# Patient Record
Sex: Male | Born: 1955 | Race: White | Hispanic: No | Marital: Married | Smoking: Never smoker
Health system: Southern US, Community
[De-identification: ages and names within clinical notes are randomized; demographics above are authoritative.]

## PROBLEM LIST (undated history)

## (undated) HISTORY — PX: SEPTOPLASTY: SUR1290

## (undated) HISTORY — PX: HERNIA REPAIR: SHX51

## (undated) HISTORY — PX: FOOT SURGERY: SHX648

---

## 2013-12-07 ENCOUNTER — Encounter: Payer: Self-pay | Admitting: Sports Medicine

## 2013-12-07 ENCOUNTER — Ambulatory Visit (INDEPENDENT_AMBULATORY_CARE_PROVIDER_SITE_OTHER): Payer: BC Managed Care – PPO | Admitting: Sports Medicine

## 2013-12-07 ENCOUNTER — Ambulatory Visit (INDEPENDENT_AMBULATORY_CARE_PROVIDER_SITE_OTHER): Payer: BC Managed Care – PPO

## 2013-12-07 VITALS — BP 131/64 | HR 77 | Ht 72.0 in | Wt 184.0 lb

## 2013-12-07 DIAGNOSIS — M19072 Primary osteoarthritis, left ankle and foot: Secondary | ICD-10-CM | POA: Insufficient documentation

## 2013-12-07 DIAGNOSIS — M25579 Pain in unspecified ankle and joints of unspecified foot: Secondary | ICD-10-CM

## 2013-12-07 DIAGNOSIS — M19079 Primary osteoarthritis, unspecified ankle and foot: Secondary | ICD-10-CM

## 2013-12-07 DIAGNOSIS — M25539 Pain in unspecified wrist: Secondary | ICD-10-CM

## 2013-12-07 DIAGNOSIS — M19039 Primary osteoarthritis, unspecified wrist: Secondary | ICD-10-CM

## 2013-12-07 MED ORDER — MELOXICAM 15 MG PO TABS: ORAL_TABLET | ORAL | Status: DC

## 2013-12-07 NOTE — Assessment & Plan Note (Signed)
X-rays, Mobic. Aspiration and injection as above. Return in one month.

## 2013-12-07 NOTE — Assessment & Plan Note (Signed)
Bilateral x-rays. Left-sided radiocarpal joint injection as above. Mobic. Return in one month.

## 2013-12-07 NOTE — Progress Notes (Signed)
Subjective:    I'm seeing this patient as a consultation for:  Dr. Greggory Brandy  CC: Left wrist pain, left ankle pain  HPI: Left wrist pain: Present for years. Localized at the radiocarpal joint, moderate, persistent, worse with flexion of the wrist. No radiation. No mechanical symptoms, no trauma.  Left ankle pain: Present for years, localized to the talocrural joint, moderate, persistent with occasional swelling.  Past medical history, Surgical history, Family history not pertinant except as noted below, Social history, Allergies, and medications have been entered into the medical record, reviewed, and no changes needed.   Review of Systems: No headache, visual changes, nausea, vomiting, diarrhea, constipation, dizziness, abdominal pain, skin rash, fevers, chills, night sweats, weight loss, swollen lymph nodes, body aches, joint swelling, muscle aches, chest pain, shortness of breath, mood changes, visual or auditory hallucinations.   Objective:   General: Well Developed, well nourished, and in no acute distress.  Neuro/Psych: Alert and oriented x3, extra-ocular muscles intact, able to move all 4 extremities, sensation grossly intact. Skin: Warm and dry, no rashes noted.  Respiratory: Not using accessory muscles, speaking in full sentences, trachea midline.  Cardiovascular: Pulses palpable, no extremity edema. Abdomen: Does not appear distended. Left Wrist: Inspection normal with no visible erythema or swelling. ROM smooth and normal with good flexion and extension and ulnar/radial deviation that is symmetrical with opposite wrist. There is reproduction of pain with flexion of the wrist, pain is localized volar to dorsal deep in the radiocarpal joint. Palpation is normal over metacarpals, navicular, lunate, and TFCC; tendons without tenderness/ swelling No snuffbox tenderness. No tenderness over Canal of Guyon. Strength 5/5 in all directions without pain. Negative Finkelstein,  tinel's and phalens. Negative Watson's test. Left Ankle: Probable effusion with a fluid wave, tenderness to palpation at the talocrural joint. Range of motion is full in all directions. Strength is 5/5 in all directions. Stable lateral and medial ligaments; squeeze test and kleiger test unremarkable; No pain at base of 5th MT; No tenderness over cuboid; No tenderness over N spot or navicular prominence No tenderness on posterior aspects of lateral and medial malleolus No sign of peroneal tendon subluxations or tenderness to palpation Negative tarsal tunnel tinel's Able to walk 4 steps.  Procedure: Real-time Ultrasound Guided Injection of left talocrural joint Device: GE Logiq E  Verbal informed consent obtained.  Time-out conducted.  Noted no overlying erythema, induration, or other signs of local infection.  Skin prepped in a sterile fashion.  Local anesthesia: Topical Ethyl chloride.  With sterile technique and under real time ultrasound guidance:  1 cc Kenalog 40, 3 cc lidocaine injected easily. Completed without difficulty  Pain immediately resolved suggesting accurate placement of the medication.  Advised to call if fevers/chills, erythema, induration, drainage, or persistent bleeding.  Images permanently stored and available for review in the ultrasound unit.  Impression: Technically successful ultrasound guided injection.  Procedure: Real-time Ultrasound Guided Injection of left radiocarpal joint Device: GE Logiq E  Verbal informed consent obtained.  Time-out conducted.  Noted no overlying erythema, induration, or other signs of local infection.  Skin prepped in a sterile fashion.  Local anesthesia: Topical Ethyl chloride.  With sterile technique and under real time ultrasound guidance:  1 cc Kenalog 40, 2 cc lidocaine injected easily. Completed without difficulty  Pain immediately resolved suggesting accurate placement of the medication.  Advised to call if  fevers/chills, erythema, induration, drainage, or persistent bleeding.  Images permanently stored and available for review in the ultrasound unit.  Impression: Technically successful ultrasound guided injection.  The ankle was strapped compressive dressing.  Impression and Recommendations:   This case required medical decision making of moderate complexity.

## 2014-01-04 ENCOUNTER — Ambulatory Visit: Payer: BC Managed Care – PPO | Admitting: Sports Medicine

## 2014-01-11 ENCOUNTER — Ambulatory Visit: Payer: BC Managed Care – PPO | Admitting: Sports Medicine

## 2014-01-17 ENCOUNTER — Encounter: Payer: Self-pay | Admitting: Sports Medicine

## 2014-01-17 ENCOUNTER — Ambulatory Visit (INDEPENDENT_AMBULATORY_CARE_PROVIDER_SITE_OTHER): Payer: BC Managed Care – PPO | Admitting: Sports Medicine

## 2014-01-17 VITALS — BP 117/70 | HR 70 | Ht 72.0 in | Wt 183.0 lb

## 2014-01-17 DIAGNOSIS — M19039 Primary osteoarthritis, unspecified wrist: Secondary | ICD-10-CM

## 2014-01-17 DIAGNOSIS — M7742 Metatarsalgia, left foot: Secondary | ICD-10-CM | POA: Insufficient documentation

## 2014-01-17 DIAGNOSIS — M19072 Primary osteoarthritis, left ankle and foot: Secondary | ICD-10-CM

## 2014-01-17 DIAGNOSIS — M19079 Primary osteoarthritis, unspecified ankle and foot: Secondary | ICD-10-CM

## 2014-01-17 DIAGNOSIS — M7750 Other enthesopathy of unspecified foot: Secondary | ICD-10-CM

## 2014-01-17 NOTE — Assessment & Plan Note (Signed)
Improved and in fact pain-free today after injection at the last visit.

## 2014-01-17 NOTE — Assessment & Plan Note (Signed)
Persistent pain despite injection. He did have relief of his ankle pain approximately one week after a left L4 nerve root injection suggesting systemic effect, there was no instantaneous relief. He did have brief but instantaneous relief following a talocrural joint injection at the last visit. We are going to proceed with MRI, I do suspect he will need ankle arthroscopy.

## 2014-01-17 NOTE — Assessment & Plan Note (Signed)
He will return for custom orthotics, likely with a left-sided metatarsal pad.

## 2014-01-17 NOTE — Progress Notes (Signed)
  Subjective:    CC: Followup  HPI: Wrist osteoarthritis : pain-free now after injection at the last visit.  Left ankle pain: With a fluid wave, suspected to be osteoarthritis of the last visit, I injected his talocrural joint and he noted immediate relief of his pain that lasted for several days, a week after the injection the pain returned. He tells me that he has seen an orthopedist in the past, he had a left L4 nerve block which did not result in any resolution of his ankle pain until approximately a week later, likely due to steroid systemic effect. He also had an MRI, results of which he is not completely sure of, he tells me that his orthopedist said that there is really nothing in the ankle. Pain is moderate, persistent, worse after riding his bike, localized in the talocrural joint both anteriorly and in the posterior lateral corner. Without radiation.  Metatarsalgia: Left-sided, painful under the head of the third metatarsal, he does have some orthotics made an outside facility which are only minimally effective.  Past medical history, Surgical history, Family history not pertinant except as noted below, Social history, Allergies, and medications have been entered into the medical record, reviewed, and no changes needed.   Review of Systems: No fevers, chills, night sweats, weight loss, chest pain, or shortness of breath.   Objective:    General: Well Developed, well nourished, and in no acute distress.  Neuro: Alert and oriented x3, extra-ocular muscles intact, sensation grossly intact.  HEENT: Normocephalic, atraumatic, pupils equal round reactive to light, neck supple, no masses, no lymphadenopathy, thyroid nonpalpable.  Skin: Warm and dry, no rashes. Cardiac: Regular rate and rhythm, no murmurs rubs or gallops, no lower extremity edema.  Respiratory: Clear to auscultation bilaterally. Not using accessory muscles, speaking in full sentences. Left Ankle: Somewhat swollen with a  fluid wave Range of motion is full in all directions. Strength is 5/5 in all directions. Stable lateral and medial ligaments; squeeze test and kleiger test unremarkable; Talar dome nontender; No pain at base of 5th MT; No tenderness over cuboid; No tenderness over N spot or navicular prominence No tenderness on posterior aspects of lateral and medial malleolus No sign of peroneal tendon subluxations or tenderness to palpation Negative tarsal tunnel tinel's Able to walk 4 steps. Left Wrist: Inspection normal with no visible erythema or swelling. ROM smooth and normal with good flexion and extension and ulnar/radial deviation that is symmetrical with opposite wrist. Palpation is normal over metacarpals, navicular, lunate, and TFCC; tendons without tenderness/ swelling No snuffbox tenderness. No tenderness over Canal of Guyon.. Strength 5/5 in all directions without pain. Negative Finkelstein, tinel's and phalens. Negative Watson's test Left Foot: Foot inspection and palpation reveals breakdown of the transverse arch and a drop of MT heads, there is significant tenderness to palpation under the metatarsal heads. Abnormal callous is present under the third metatarsal heads. Hammer toes are present but mild.  Pain improved significantly with the addition of a metatarsal pad.  Impression and Recommendations:

## 2014-01-18 ENCOUNTER — Telehealth: Payer: Self-pay | Admitting: *Deleted

## 2014-01-18 NOTE — Telephone Encounter (Signed)
PA obtained for MRI Ankle w/o contrast. Auth # [id number]. Exp. 02/15/14.  Meyer CoryMisty Ahmad, LPN

## 2014-01-29 ENCOUNTER — Ambulatory Visit (HOSPITAL_BASED_OUTPATIENT_CLINIC_OR_DEPARTMENT_OTHER)
Admission: RE | Admit: 2014-01-29 | Discharge: 2014-01-29 | Disposition: A | Discharge status: Home / Self Care | Payer: BC Managed Care – PPO | Source: Ambulatory Visit | Attending: Sports Medicine | Admitting: Sports Medicine

## 2014-01-29 DIAGNOSIS — M25579 Pain in unspecified ankle and joints of unspecified foot: Secondary | ICD-10-CM | POA: Insufficient documentation

## 2014-02-24 ENCOUNTER — Ambulatory Visit (INDEPENDENT_AMBULATORY_CARE_PROVIDER_SITE_OTHER): Payer: BC Managed Care – PPO | Admitting: Sports Medicine

## 2014-02-24 ENCOUNTER — Encounter: Payer: Self-pay | Admitting: Sports Medicine

## 2014-02-24 VITALS — BP 128/70 | HR 70 | Ht 72.0 in | Wt 184.0 lb

## 2014-02-24 DIAGNOSIS — M19072 Primary osteoarthritis, left ankle and foot: Secondary | ICD-10-CM

## 2014-02-24 DIAGNOSIS — M7742 Metatarsalgia, left foot: Secondary | ICD-10-CM

## 2014-02-24 DIAGNOSIS — M7750 Other enthesopathy of unspecified foot: Secondary | ICD-10-CM

## 2014-02-24 DIAGNOSIS — M19079 Primary osteoarthritis, unspecified ankle and foot: Secondary | ICD-10-CM

## 2014-02-24 DIAGNOSIS — M19039 Primary osteoarthritis, unspecified wrist: Secondary | ICD-10-CM

## 2014-02-24 NOTE — Assessment & Plan Note (Signed)
Custom orthotics as above. 

## 2014-02-24 NOTE — Assessment & Plan Note (Signed)
Improved with ankle injection but still with anterior ankle paresthesias and soreness. MRI shows arthritis in the subtalar joints. Suspect more radicular etiology. Bring MRI to review, did have a good response to prior L4 nerve root block.

## 2014-02-24 NOTE — Progress Notes (Signed)
    Patient was fitted for a : standard, cushioned, semi-rigid orthotic. The orthotic was heated and afterward the patient stood on the orthotic blank positioned on the orthotic stand. The patient was positioned in subtalar neutral position and 10 degrees of ankle dorsiflexion in a weight bearing stance. After completion of molding, a stable base was applied to the orthotic blank. The blank was ground to a stable position for weight bearing. Size: 10 Base: Blue EVA Additional Posting and Padding: Left-sided f metatarsal pad The patient ambulated these, and they were very comfortable.  I spent 40 minutes with this patient, greater than 50% was face-to-face time counseling regarding the below diagnosis.

## 2014-02-24 NOTE — Assessment & Plan Note (Signed)
Continues to be resolved after injection. 

## 2016-07-18 ENCOUNTER — Ambulatory Visit (INDEPENDENT_AMBULATORY_CARE_PROVIDER_SITE_OTHER): Payer: BLUE CROSS/BLUE SHIELD | Admitting: Sports Medicine

## 2016-07-18 ENCOUNTER — Encounter: Payer: Self-pay | Admitting: Sports Medicine

## 2016-07-18 DIAGNOSIS — M79671 Pain in right foot: Secondary | ICD-10-CM

## 2016-07-18 DIAGNOSIS — Q5529 Other congenital malformations of testis and scrotum: Secondary | ICD-10-CM | POA: Diagnosis not present

## 2016-07-18 DIAGNOSIS — Q988 Other specified sex chromosome abnormalities, male phenotype: Secondary | ICD-10-CM | POA: Insufficient documentation

## 2016-07-18 DIAGNOSIS — M129 Arthropathy, unspecified: Secondary | ICD-10-CM

## 2016-07-18 DIAGNOSIS — M19072 Primary osteoarthritis, left ankle and foot: Secondary | ICD-10-CM

## 2016-07-18 DIAGNOSIS — S86011A Strain of right Achilles tendon, initial encounter: Secondary | ICD-10-CM | POA: Insufficient documentation

## 2016-07-18 MED ORDER — NITROGLYCERIN 0.2 MG/HR TD PT24: MEDICATED_PATCH | TRANSDERMAL | 11 refills | Status: DC

## 2016-07-18 MED ORDER — DICLOFENAC SODIUM 75 MG PO TBEC: 75.0000 mg | DELAYED_RELEASE_TABLET | Freq: Two times a day (BID) | ORAL | 3 refills | Status: DC

## 2016-07-18 MED ORDER — TESTOSTERONE 40.5 MG/2.5GM (1.62%) TD GEL: 1.0000 "application " | Freq: Every day | TRANSDERMAL | 0 refills | Status: DC

## 2016-07-18 NOTE — Assessment & Plan Note (Signed)
With persistent pain at the anterior tibiotalar joint. Next line this improved significant after an injection several years ago. Having recurrence of pain, needs bilateral ankle compression sleeves.

## 2016-07-18 NOTE — Assessment & Plan Note (Signed)
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 Bilateral heel lifts, rehabilitation exercises, topical nitroglycerin. Retrocalcaneal bursa injection. Also needs to get bilateral ankle compression sleeves. Agrees to not bike or run for the next 5 days. Return to see me in one month.

## 2016-07-18 NOTE — Assessment & Plan Note (Signed)
With hypogonadism and infertility. Would like to switch from parenteral testosterone, I am going to give him a prescription for Androgel. He has had greater than 2 low testosterone levels on previous blood checks. Further follow-up with PCP.

## 2016-07-18 NOTE — Progress Notes (Signed)
Subjective:    I'm seeing this patient as a consultation for:  Dr. Angelena SoleWeston Saunders  CC: Right heel pain  HPI: This is a pleasant 60 year old male, I have not seen him some time, for the past several months he has had pain that he localizes in the posterior aspect of his right heel. He did have x-rays that showed some insertional calcifications, and has had some "ozone" injections by his PCP. Unfortunately continues to have pain.  Pain is severe, persistent, it keeps him from running and biking.  Left ankle pain: Known osteoarthritis, pain is anterior to tibiotalar joint. I injection 2+ years ago provided good relief, hasn't really done anything since. Also not really taking his meloxicam, pain is moderate, persistent, localized without radiation.  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 Would like to move to something non-injected and is agreeable to proceed with topical testosterone treatment.  Past medical history:  Negative.  See flowsheet/record as well for more information.  Surgical history: Negative.  See flowsheet/record as well for more information.  Family history: Negative.  See flowsheet/record as well for more information.  Social history: Negative.  See flowsheet/record as well for more information.  Allergies, and medications have been entered into the medical record, reviewed, and no changes needed.   Review of Systems: No headache, visual changes, nausea, vomiting, diarrhea, constipation, dizziness, abdominal pain, skin rash, fevers, chills, night sweats, weight loss, swollen lymph nodes, body aches, joint swelling, muscle aches, chest pain, shortness of breath, mood changes, visual or auditory hallucinations.   Objective:   General: Well Developed, well nourished, and in no acute distress.  Neuro/Psych: Alert and oriented x3, extra-ocular muscles intact, able to move all 4 extremities, sensation  grossly intact. Skin: Warm and dry, no rashes noted.  Respiratory: Not using accessory muscles, speaking in full sentences, trachea midline.  Cardiovascular: Pulses palpable, no extremity edema. Abdomen: Does not appear distended. Right Ankle: Visibly erythematous and swollen at the Achilles insertion Range of motion is full in all directions. Strength is 5/5 in all directions. Stable lateral and medial ligaments; squeeze test and kleiger test unremarkable; Talar dome nontender; No pain at base of 5th MT; No tenderness over cuboid; No tenderness over N spot or navicular prominence No tenderness on posterior aspects of lateral and medial malleolus No sign of peroneal tendon subluxations; Negative tarsal tunnel tinel's Able to walk 4 steps.  Procedure: Real-time Ultrasound Guided Injection of right retrocalcaneal bursa Device: GE Logiq E  Verbal informed consent obtained.  Time-out conducted.  Noted no overlying erythema, induration, or other signs of local infection.  Skin prepped in a sterile fashion.  Local anesthesia: Topical Ethyl chloride.  With sterile technique and under real time ultrasound guidance:  Noted mild retrocalcaneal bursitis on ultrasound just deep to the Achilles insertion of the calcaneus, I guided a 25-gauge needle into this bursa and injected 1/2 mL kenalog 40, 1/2 mL lidocaine. There was significant Achilles insertional spurring. Completed without difficulty  Pain immediately resolved suggesting accurate placement of the medication.  Advised to call if fevers/chills, erythema, induration, drainage, or persistent bleeding.  Images permanently stored and available for review in the ultrasound unit.  Impression: Technically successful ultrasound guided injection.   Impression and Recommendations:   This case required medical decision making of moderate complexity.  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 Bilateral  heel lifts, rehabilitation exercises, topical nitroglycerin. Retrocalcaneal bursa injection. Also needs to get  bilateral ankle compression sleeves. Agrees to not bike or run for the next 5 days. Return to see me in one month.  Arthritis of left ankle With persistent pain at the anterior tibiotalar joint. Next line this improved significant after an injection several years ago. Having recurrence of pain, needs bilateral ankle compression sleeves.  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 Would like to switch from parenteral testosterone, I am going to give him a prescription for Androgel. He has had greater than 2 low testosterone levels on previous blood checks. Further follow-up with PCP.

## 2016-08-15 ENCOUNTER — Encounter: Payer: Self-pay | Admitting: Sports Medicine

## 2016-08-15 ENCOUNTER — Ambulatory Visit (INDEPENDENT_AMBULATORY_CARE_PROVIDER_SITE_OTHER): Payer: BLUE CROSS/BLUE SHIELD | Admitting: Sports Medicine

## 2016-08-15 DIAGNOSIS — M79671 Pain in right foot: Secondary | ICD-10-CM | POA: Diagnosis not present

## 2016-08-15 NOTE — Assessment & Plan Note (Signed)
Did extremely well with resolution of pain after a retrocalcaneal bursa injection. Pain-free now. Did get some dyspepsia with Voltaren, history of peptic ulcer disease, we will discontinue all NSAIDs, he does need to continue his heel lift, and may continue topical nitroglycerin for now. Return as needed.

## 2016-08-15 NOTE — Progress Notes (Signed)
  Subjective:    CC: Follow-up  HPI: Right heel pain: Resolved after retrocalcaneal bursa injection.  Past medical history:  Negative.  See flowsheet/record as well for more information.  Surgical history: Negative.  See flowsheet/record as well for more information.  Family history: Negative.  See flowsheet/record as well for more information.  Social history: Negative.  See flowsheet/record as well for more information.  Allergies, and medications have been entered into the medical record, reviewed, and no changes needed.   Review of Systems: No fevers, chills, night sweats, weight loss, chest pain, or shortness of breath.   Objective:    General: Well Developed, well nourished, and in no acute distress.  Neuro: Alert and oriented x3, extra-ocular muscles intact, sensation grossly intact.  HEENT: Normocephalic, atraumatic, pupils equal round reactive to light, neck supple, no masses, no lymphadenopathy, thyroid nonpalpable.  Skin: Warm and dry, no rashes. Cardiac: Regular rate and rhythm, no murmurs rubs or gallops, no lower extremity edema.  Respiratory: Clear to auscultation bilaterally. Not using accessory muscles, speaking in full sentences. Right Ankle: No visible erythema or swelling. Range of motion is full in all directions. Strength is 5/5 in all directions. Stable lateral and medial ligaments; squeeze test and kleiger test unremarkable; Talar dome nontender; No pain at base of 5th MT; No tenderness over cuboid; No tenderness over N spot or navicular prominence No tenderness on posterior aspects of lateral and medial malleolus No sign of peroneal tendon subluxations; Negative tarsal tunnel tinel's Able to walk 4 steps.  Impression and Recommendations:    Pain of right heel Did extremely well with resolution of pain after a retrocalcaneal bursa injection. Pain-free now. Did get some dyspepsia with Voltaren, history of peptic ulcer disease, we will discontinue all  NSAIDs, he does need to continue his heel lift, and may continue topical nitroglycerin for now. Return as needed.

## 2017-03-20 ENCOUNTER — Encounter: Payer: Self-pay | Admitting: Sports Medicine

## 2017-03-20 ENCOUNTER — Ambulatory Visit (INDEPENDENT_AMBULATORY_CARE_PROVIDER_SITE_OTHER): Payer: BLUE CROSS/BLUE SHIELD | Admitting: Sports Medicine

## 2017-03-20 DIAGNOSIS — M7751 Other enthesopathy of right foot: Secondary | ICD-10-CM | POA: Diagnosis not present

## 2017-03-20 NOTE — Assessment & Plan Note (Signed)
8 months of relief with previous injection, repeat retrocalcaneal bursa injection today, return as needed. He should wear his heel lifts for the next week.

## 2017-03-20 NOTE — Progress Notes (Signed)
  Subjective:    CC: Right ankle pain  HPI: This is a pleasant 61 year old male with a history of retrocalcaneal bursitis injected 8 months ago, good results, he really never did any therapy. He is having recurrence of pain after working hard in his yard. Severe, persistent, localized without radiation.  Past medical history:  Negative.  See flowsheet/record as well for more information.  Surgical history: Negative.  See flowsheet/record as well for more information.  Family history: Negative.  See flowsheet/record as well for more information.  Social history: Negative.  See flowsheet/record as well for more information.  Allergies, and medications have been entered into the medical record, reviewed, and no changes needed.   Review of Systems: No fevers, chills, night sweats, weight loss, chest pain, or shortness of breath.   Objective:    General: Well Developed, well nourished, and in no acute distress.  Neuro: Alert and oriented x3, extra-ocular muscles intact, sensation grossly intact.  HEENT: Normocephalic, atraumatic, pupils equal round reactive to light, neck supple, no masses, no lymphadenopathy, thyroid nonpalpable.  Skin: Warm and dry, no rashes. Cardiac: Regular rate and rhythm, no murmurs rubs or gallops, no lower extremity edema.  Respiratory: Clear to auscultation bilaterally. Not using accessory muscles, speaking in full sentences. Right Foot: No visible erythema or swelling. Range of motion is full in all directions. Strength is 5/5 in all directions. No hallux valgus. No pes cavus or pes planus. No abnormal callus noted. No pain over the navicular prominence, or base of fifth metatarsal. No tenderness to palpation of the calcaneal insertion of plantar fascia. No pain at the Achilles insertion. No pain over the calcaneal bursa. Exquisite tender over the retrocalcaneal bursa No tenderness to palpation over the tarsals, metatarsals, or phalanges. No hallux rigidus  or limitus. No tenderness palpation over interphalangeal joints. No pain with compression of the metatarsal heads. Neurovascularly intact distally.  Procedure: Real-time Ultrasound Guided Injection of right retrocalcaneal bursa Device: GE Logiq E  Verbal informed consent obtained.  Time-out conducted.  Noted no overlying erythema, induration, or other signs of local infection.  Skin prepped in a sterile fashion.  Local anesthesia: Topical Ethyl chloride.  With sterile technique and under real time ultrasound guidance:  25-gauge needle advanced into the bursa and taking care to avoid intra-Achilles injection I placed 1 mL kenalog 40, 1 mL lidocaine easily Completed without difficulty  Pain immediately resolved suggesting accurate placement of the medication.  Advised to call if fevers/chills, erythema, induration, drainage, or persistent bleeding.  Images permanently stored and available for review in the ultrasound unit.  Impression: Technically successful ultrasound guided injection.   A heel lift was placed in his right shoe.  Impression and Recommendations:    Retrocalcaneal bursitis, right 8 months of relief with previous injection, repeat retrocalcaneal bursa injection today, return as needed. He should wear his heel lifts for the next week.

## 2017-04-17 ENCOUNTER — Ambulatory Visit: Payer: BLUE CROSS/BLUE SHIELD | Admitting: Sports Medicine

## 2017-05-16 ENCOUNTER — Ambulatory Visit (INDEPENDENT_AMBULATORY_CARE_PROVIDER_SITE_OTHER): Payer: BLUE CROSS/BLUE SHIELD | Admitting: Sports Medicine

## 2017-05-16 ENCOUNTER — Encounter: Payer: Self-pay | Admitting: Sports Medicine

## 2017-05-16 DIAGNOSIS — M7751 Other enthesopathy of right foot: Secondary | ICD-10-CM | POA: Diagnosis not present

## 2017-05-16 NOTE — Progress Notes (Signed)
  Subjective:    CC: Right heel pain  HPI: Victor Hartman is a pleasant 61 year old male, I did a retrocalcaneal bursa injection 2 months ago, he did extremely well, he is riding his bike about 160 miles per week, unfortunately he has developed increasing pain, in a different location, somewhat more proximal at the distal soleus but distal to the gastrocnemius. Pain is moderate, persistent without radiation.  Past medical history:  Negative.  See flowsheet/record as well for more information.  Surgical history: Negative.  See flowsheet/record as well for more information.  Family history: Negative.  See flowsheet/record as well for more information.  Social history: Negative.  See flowsheet/record as well for more information.  Allergies, and medications have been entered into the medical record, reviewed, and no changes needed.   Review of Systems: No fevers, chills, night sweats, weight loss, chest pain, or shortness of breath.   Objective:    General: Well Developed, well nourished, and in no acute distress.  Neuro: Alert and oriented x3, extra-ocular muscles intact, sensation grossly intact.  HEENT: Normocephalic, atraumatic, pupils equal round reactive to light, neck supple, no masses, no lymphadenopathy, thyroid nonpalpable.  Skin: Warm and dry, no rashes. Cardiac: Regular rate and rhythm, no murmurs rubs or gallops, no lower extremity edema.  Respiratory: Clear to auscultation bilaterally. Not using accessory muscles, speaking in full sentences. Right Ankle: No visible erythema or swelling. Range of motion is full in all directions. Strength is 5/5 in all directions. Stable lateral and medial ligaments; squeeze test and kleiger test unremarkable; Talar dome nontender; No pain at base of 5th MT; No tenderness over cuboid; No tenderness over N spot or navicular prominence No tenderness on posterior aspects of lateral and medial malleolus No sign of peroneal tendon subluxations; Negative  tarsal tunnel tinel's Able to walk 4 steps.  Ankle was strapped with compressive dressing.  Impression and Recommendations:    Retrocalcaneal bursitis, right Overall did extremely well after a retrocalcaneal bursa injection on the right 2 months ago. He's done a lot of riding, overall 180 miles in the last week, she has more of the soleus strain today more than anything else, is improved considerably over the past 2 days, has been there less than a week. I think we just need to treat this conservatively, ice for 20 minutes 3-4 times a day and I applied a compression wrap to his right ankle. Return to see me if he is not better in a couple of weeks, and we would apply a boot at that time.  I spent 25 minutes with this patient, greater than 50% was face-to-face time counseling regarding the above diagnoses

## 2017-05-16 NOTE — Assessment & Plan Note (Signed)
Overall did extremely well after a retrocalcaneal bursa injection on the right 2 months ago. He's done a lot of riding, overall 180 miles in the last week, she has more of the soleus strain today more than anything else, is improved considerably over the past 2 days, has been there less than a week. I think we just need to treat this conservatively, ice for 20 minutes 3-4 times a day and I applied a compression wrap to his right ankle. Return to see me if he is not better in a couple of weeks, and we would apply a boot at that time.

## 2017-06-03 ENCOUNTER — Telehealth: Payer: Self-pay | Admitting: *Deleted

## 2017-06-03 NOTE — Telephone Encounter (Signed)
Pt called and lvm to give an update on how he is doing. He stated that he is hopping around, his progress is going slow and doesn't seem to be healing. He wanted to know should he come in to be seen.    Will route to Dr. Benjamin Stainhekkekandam for advice.Loralee PacasBarkley, Brandi Armato [place]

## 2017-06-03 NOTE — Telephone Encounter (Signed)
I think it's time for the MRI. If he is okay with it I will order it.

## 2017-06-09 NOTE — Telephone Encounter (Signed)
Pt states that he wants to wait 1 week and see how it is by then. Pt stated that he will call the office in a week to schedule a MRI if it is not better.

## 2017-06-16 ENCOUNTER — Telehealth: Payer: Self-pay

## 2017-06-16 DIAGNOSIS — G8929 Other chronic pain: Secondary | ICD-10-CM

## 2017-06-16 DIAGNOSIS — M25571 Pain in right ankle and joints of right foot: Principal | ICD-10-CM

## 2017-06-16 NOTE — Telephone Encounter (Signed)
I think it's time to do both, and MRI and a boot. If he has a boot he should start wearing it, if not he should make an appointment for a nurse visit, I'm also ordering the MRI. He has failed greater than 2 months of physician directed conservative measures including multiple injections.

## 2017-06-16 NOTE — Telephone Encounter (Signed)
Patient advised and scheduled.  

## 2017-06-16 NOTE — Telephone Encounter (Signed)
Victor Hartman complains of right leg pain. He is not better. He states he is ready for the boot, physical therapy and/or MRI. He states he will do what ever Dr Benjamin Stain wants. Please advise.

## 2017-06-17 ENCOUNTER — Ambulatory Visit (INDEPENDENT_AMBULATORY_CARE_PROVIDER_SITE_OTHER): Payer: BLUE CROSS/BLUE SHIELD | Admitting: Sports Medicine

## 2017-06-17 VITALS — BP 131/82 | HR 81 | Wt 191.0 lb

## 2017-06-17 DIAGNOSIS — G8929 Other chronic pain: Secondary | ICD-10-CM | POA: Diagnosis not present

## 2017-06-17 DIAGNOSIS — M25571 Pain in right ankle and joints of right foot: Secondary | ICD-10-CM

## 2017-06-17 NOTE — Progress Notes (Signed)
Patient here for boot fitting. He was placed in a medium short aircast boot. Patient had many questions about how active he should be with and if he could continue to ride his bike. He stated the boot felt like a good fit but not sure if this would be beneficial. Dr. Benjamin Stain did provide reassurance to the patient and answered his questions. The patient will schedule a f/u with Dr. Benjamin Stain in 2 weeks  I did spend a significant amount of time with this patient answering his questions. ___________________________________________ Victor Hartman. Benjamin Stain, M.D., ABFM., CAQSM. Primary Care and Sports Medicine [facility]  Adjunct Instructor of Family Medicine  [employer]

## 2017-07-01 ENCOUNTER — Ambulatory Visit (INDEPENDENT_AMBULATORY_CARE_PROVIDER_SITE_OTHER): Payer: BLUE CROSS/BLUE SHIELD | Admitting: Sports Medicine

## 2017-07-01 ENCOUNTER — Ambulatory Visit (INDEPENDENT_AMBULATORY_CARE_PROVIDER_SITE_OTHER): Payer: BLUE CROSS/BLUE SHIELD

## 2017-07-01 ENCOUNTER — Ambulatory Visit: Payer: BLUE CROSS/BLUE SHIELD | Admitting: Sports Medicine

## 2017-07-01 DIAGNOSIS — Z0189 Encounter for other specified special examinations: Secondary | ICD-10-CM

## 2017-07-01 DIAGNOSIS — M7661 Achilles tendinitis, right leg: Secondary | ICD-10-CM

## 2017-07-01 DIAGNOSIS — S86011A Strain of right Achilles tendon, initial encounter: Secondary | ICD-10-CM | POA: Diagnosis not present

## 2017-07-01 NOTE — Progress Notes (Signed)
  Subjective:    CC: MRI results  HPI: Jomarie LongsJoseph returns, we have been treating him for a clinical retrocalcaneal bursitis, he had a cecal injection sometime ago, ultimately he did not improve so we placed him in a boot and obtained an MRI. He has actually done somewhat better in the boot. He is understandably anxious about being able to exercise again.  Past medical history:  Negative.  See flowsheet/record as well for more information.  Surgical history: Negative.  See flowsheet/record as well for more information.  Family history: Negative.  See flowsheet/record as well for more information.  Social history: Negative.  See flowsheet/record as well for more information.  Allergies, and medications have been entered into the medical record, reviewed, and no changes needed.   Review of Systems: No fevers, chills, night sweats, weight loss, chest pain, or shortness of breath.   Objective:    General: Well Developed, well nourished, and in no acute distress.  Neuro: Alert and oriented x3, extra-ocular muscles intact, sensation grossly intact.  HEENT: Normocephalic, atraumatic, pupils equal round reactive to light, neck supple, no masses, no lymphadenopathy, thyroid nonpalpable.  Skin: Warm and dry, no rashes. Cardiac: Regular rate and rhythm, no murmurs rubs or gallops, no lower extremity edema.  Respiratory: Clear to auscultation bilaterally. Not using accessory muscles, speaking in full sentences.  MRI personally reviewed, there is a partial tear of the lateral aspect of the Achilles insertion. No retraction.  I placed #2 heel pads in his boot.  Impression and Recommendations:    Partial Achilles tendon tear, right, initial encounter Boot with a heel lift. We will do this for a week, if he hasn't noticed any improvement we will do a cast in plantar flexion. Initially however we are going to do a quarter nitroglycerin patches well, he was cautioned against stretching.  I spent 40  minutes with this patient, greater than 50% was face-to-face time counseling regarding the above diagnoses, he had multiple questions about various emerging therapies for Achilles tendon tears. I answered multiple questions and explained him the importance of using treatments that were proven in randomized controlled studies. ___________________________________________ Ihor Austinhomas J. Benjamin Stainhekkekandam, M.D., ABFM., CAQSM. Primary Care and Sports Medicine [facility]  Adjunct Instructor of Family Medicine  [employer]

## 2017-07-01 NOTE — Assessment & Plan Note (Signed)
Boot with a heel lift. We will do this for a week, if he hasn't noticed any improvement we will do a cast in plantar flexion. Initially however we are going to do a quarter nitroglycerin patches well, he was cautioned against stretching.

## 2017-07-21 ENCOUNTER — Telehealth: Payer: Self-pay | Admitting: Family Medicine

## 2017-07-21 NOTE — Telephone Encounter (Signed)
Pt called and Achilles Tendon is a little better but not much. Should patient schedule PRP appt as discussed or Physical Therapy? Please advise on what he needs next

## 2017-07-22 NOTE — Telephone Encounter (Signed)
If we do PRP, thats done as a percutaneous tenotomy so it will also require cast placement.  He can do whatever he wants as long as it doesn't cause any pain.  Otherwise, treatment of some type is necessary.  If he has anxiety about being in a cast, or claustrophobia, I can control that with medication while he has the cast on.

## 2017-07-22 NOTE — Telephone Encounter (Signed)
Notified pt, he will call back and schedule.

## 2017-07-22 NOTE — Telephone Encounter (Signed)
We already discussed cast placement earlier this month if insufficient relief after a month in the boot.  If its really not progressing then he will need a walking cast placed.

## 2017-07-22 NOTE — Telephone Encounter (Signed)
Pt is struggling with the cast issue.  He is asking about the PRP, he would rather do that than the cast.  Or if there is anything else he could do, he is not riding his bike, not exercising, having his lawn mowed, limiting walking.  Please advise.

## 2017-07-28 ENCOUNTER — Telehealth: Payer: Self-pay

## 2017-07-28 DIAGNOSIS — S86011A Strain of right Achilles tendon, initial encounter: Secondary | ICD-10-CM

## 2017-07-28 NOTE — Telephone Encounter (Signed)
Castin the [facility] treatment will not work for him at this time. He would like to try PT. Please advise.

## 2017-07-31 ENCOUNTER — Telehealth: Payer: Self-pay

## 2017-07-31 NOTE — Telephone Encounter (Signed)
Victor Hartman has an appointment on Monday with [facility]. He wanted the notes and MRI faxed to their office. Notes have been faxed.   Fax - [phone]

## 2017-08-28 ENCOUNTER — Encounter: Payer: Self-pay | Admitting: Sports Medicine

## 2017-08-28 DIAGNOSIS — M6788 Other specified disorders of synovium and tendon, other site: Secondary | ICD-10-CM

## 2017-12-27 IMAGING — MR MR ANKLE*R* W/O CM
5 series · 40 of 40 positions shown · non-contrast
Comparison: None.

CLINICAL DATA: Burning in the ankle while cycling.

EXAM:
MRI OF THE RIGHT ANKLE WITHOUT CONTRAST
TECHNIQUE: Multiplanar, multisequence MR imaging of the ankle was performed. No
intravenous contrast was administered.

[Series 3: PD fat-sat · axial · 3.0mm · 0.70mm/px · z∈[-99,+47]mm · 10 of 45 slices shown]
[im 1/45]
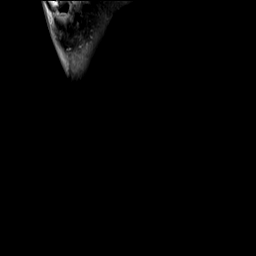
[im 5/45]
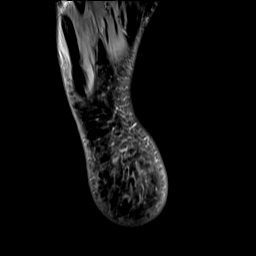
[im 10/45]
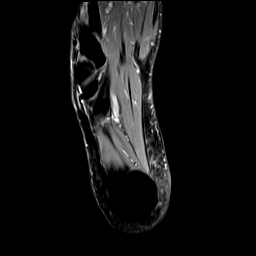
[im 15/45]
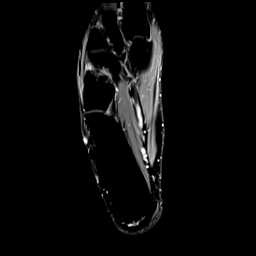
[im 20/45]
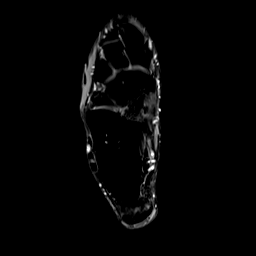
[im 25/45]
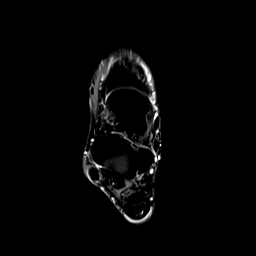
[im 30/45]
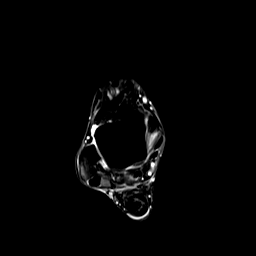
[im 35/45]
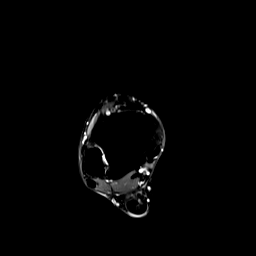
[im 40/45]
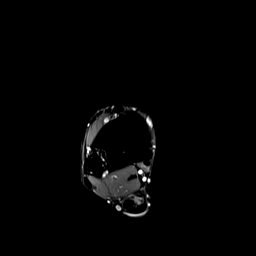
[im 45/45]
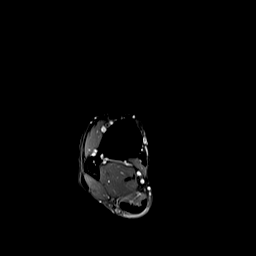

[Series 4: T2 fat-sat · axial · 3.0mm · 0.70mm/px · z∈[-99,+47]mm · 10 of 45 slices shown (1 of 3)]
[im 1/45]
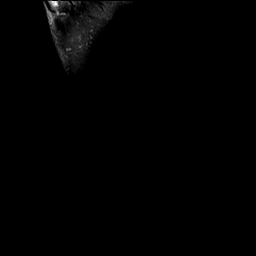
[im 5/45]
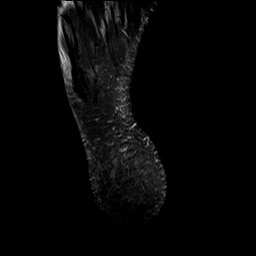
[im 10/45]
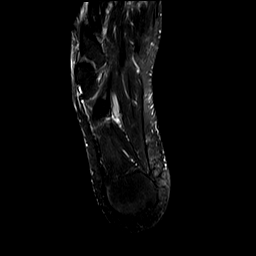
[im 15/45]
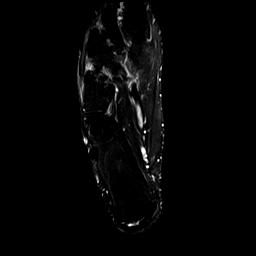
[im 20/45]
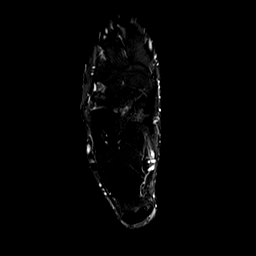
[im 25/45]
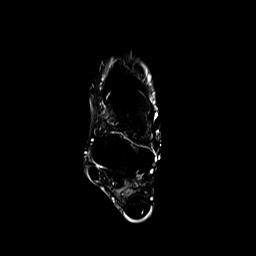
[im 30/45]
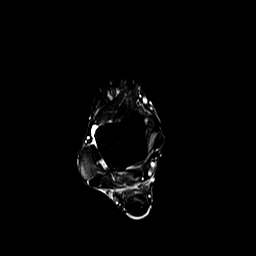
[im 35/45]
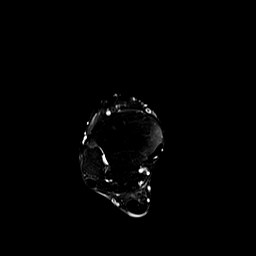
[im 40/45]
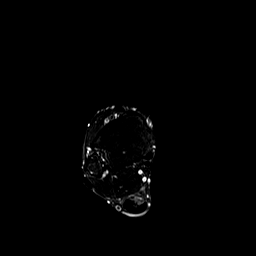
[im 45/45]
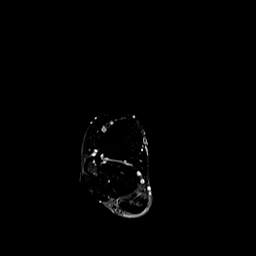

[Series 5: T2 fat-sat · coronal · 3.0mm · 0.70mm/px · 10 of 44 slices shown (2 of 3)]
[im 1/44]
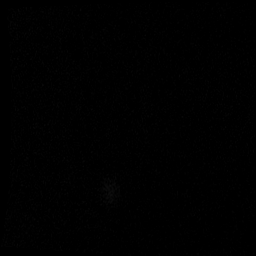
[im 5/44]
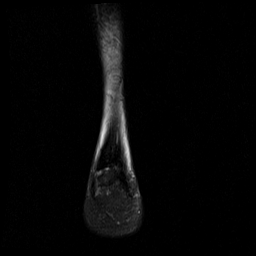
[im 10/44]
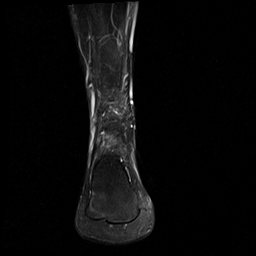
[im 15/44]
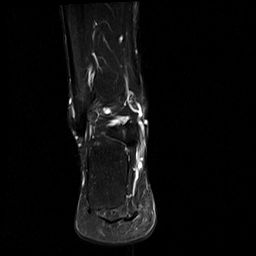
[im 20/44]
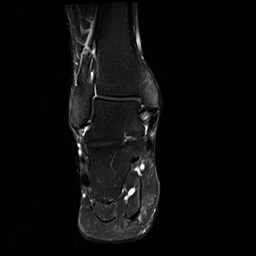
[im 24/44]
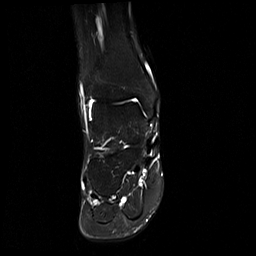
[im 29/44]
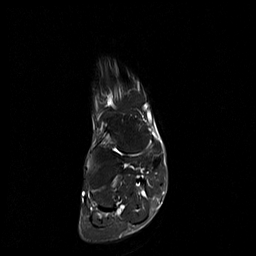
[im 34/44]
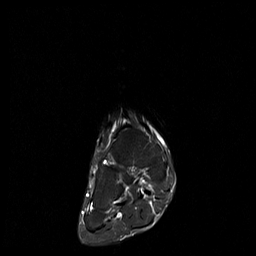
[im 39/44]
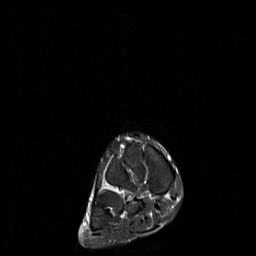
[im 44/44]
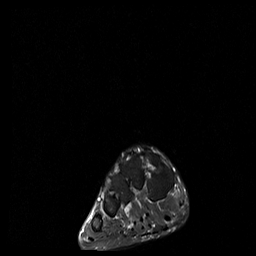

[Series 6: T1 · sagittal · 3.0mm · 0.56mm/px · 5 of 25 slices shown]
[im 1/25]
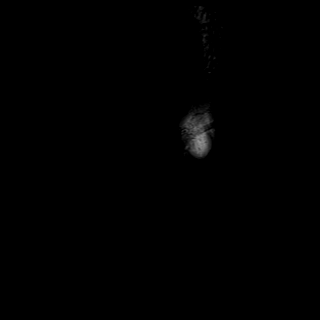
[im 7/25]
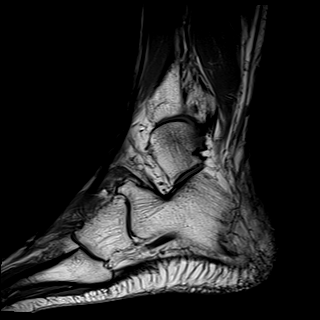
[im 13/25]
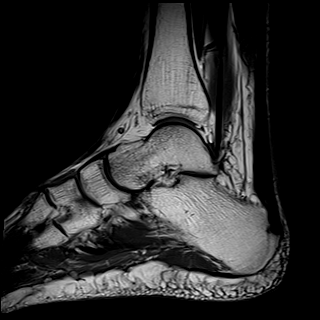
[im 19/25]
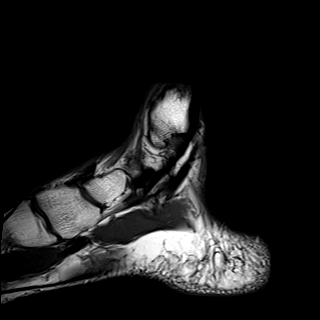
[im 25/25]
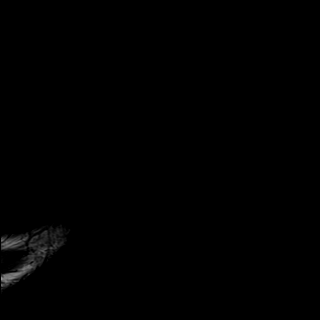

[Series 7: T2 fat-sat · sagittal · 3.0mm · 0.70mm/px · 5 of 25 slices shown (3 of 3)]
[im 1/25]
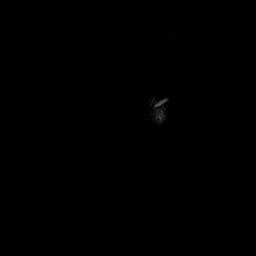
[im 7/25]
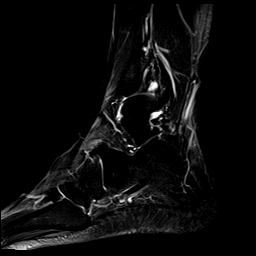
[im 13/25]
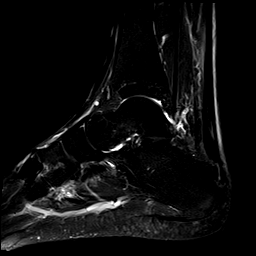
[im 19/25]
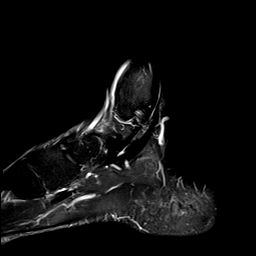
[im 25/25]
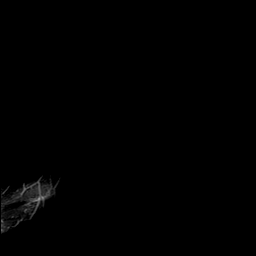

[40 of 40 positions shown; findings below may reference images not displayed]

FINDINGS: TENDONS

Peroneal: Peroneal longus tendon intact. Peroneal brevis intact.

Posteromedial: Posterior tibial tendon intact. Flexor hallucis
longus tendon intact. Flexor digitorum longus tendon intact.

Anterior: Tibialis anterior tendon intact. Extensor hallucis longus
tendon intact Extensor digitorum longus tendon intact.

Achilles: Moderate tendinosis of the Achilles tendon with a
high-grade partial-thickness tear of the lateral half of the
Achilles tendon at the insertion.

Plantar Fascia: Intact.

LIGAMENTS

Lateral: Anterior talofibular ligament intact. Calcaneofibular
ligament intact. Posterior talofibular ligament intact. Anterior and
posterior tibiofibular ligaments intact.

Medial: Deltoid ligament intact. Spring ligament intact.

CARTILAGE

Ankle Joint: No joint effusion. Normal ankle mortise. No chondral
defect.

Subtalar Joints/Sinus Tarsi: Normal subtalar joints. No subtalar
joint effusion. Normal sinus tarsi.

Bones: No marrow signal abnormality.  No fracture or dislocation.

Soft Tissue: No fluid collection or hematoma.
IMPRESSION: 1. Moderate tendinosis of the Achilles tendon with a high-grade
partial-thickness tear of the lateral half of the Achilles tendon at
the insertion.

## 2023-01-11 ENCOUNTER — Encounter: Payer: Self-pay | Admitting: Emergency Medicine

## 2023-01-11 ENCOUNTER — Ambulatory Visit: Payer: Medicare Other

## 2023-01-11 ENCOUNTER — Ambulatory Visit
Admission: EM | Admit: 2023-01-11 | Discharge: 2023-01-11 | Disposition: A | Discharge status: Home / Self Care | Payer: Medicare Other | Attending: Family Medicine | Admitting: Family Medicine

## 2023-01-11 DIAGNOSIS — L089 Local infection of the skin and subcutaneous tissue, unspecified: Secondary | ICD-10-CM | POA: Diagnosis not present

## 2023-01-11 MED ORDER — HYDROCODONE-ACETAMINOPHEN 7.5-325 MG PO TABS: 1.0000 | ORAL_TABLET | Freq: Four times a day (QID) | ORAL | 0 refills | Status: AC | PRN

## 2023-01-11 MED ORDER — CEPHALEXIN 500 MG PO CAPS: 500.0000 mg | ORAL_CAPSULE | Freq: Three times a day (TID) | ORAL | 0 refills | Status: AC

## 2023-01-11 NOTE — ED Notes (Signed)
Standing order for left foot xray canceled by Dr Meda Coffee

## 2023-01-11 NOTE — Discharge Instructions (Signed)
Take cephalexin (Keflex) 3 times a day Warm soaks may help Take your Celebrex or usual anti-inflammatory daily Take hydrocodone if needed for severe pain.  Do not drive on the hydrocodone See your foot doctor if this problem persists

## 2023-01-11 NOTE — ED Provider Notes (Signed)
[name]    CSN: [id number] Arrival date & time: 01/11/23  1253      History   Chief Complaint Chief Complaint  Patient presents with   Foot Pain    Left small toe     HPI Victor Hartman is a 67 y.o. male.   HPI  Patient states he hit his toe on a door jam about 3 weeks ago.  It has been painful ever since then to a moderate degree.  Currently, it is severely painful for the last 3 days.  Getting worse.  More red and more swollen.  Worries about infection.  History reviewed. No pertinent past medical history.  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 Authorizing Provider  amphetamine-dextroamphetamine (ADDERALL) 15 MG tablet Take by mouth. 11/19/22  Yes [provider]  celecoxib (CELEBREX) 200 MG capsule Take by mouth. 01/09/23  Yes [provider]  cephALEXin (KEFLEX) 500 MG capsule Take 1 capsule (500 mg total) by mouth 3 (three) times daily. 01/11/23  Yes Raylene Everts, MD  cetirizine (ZYRTEC) 10 MG tablet Take by mouth. 12/18/17  Yes [provider]  Cholecalciferol 125 MCG (5000 UT) capsule Take 1 tablet by mouth daily. 08/03/20  Yes [provider]  diazepam (VALIUM) 5 MG tablet Take 1 tablet 30 minutes prior to MRI and then may repeat if needed. 12/12/22  Yes [provider]  HYDROcodone-acetaminophen (NORCO) 7.5-325 MG tablet Take 1 tablet by mouth every 6 (six) hours as needed for moderate pain. 01/11/23  Yes Raylene Everts, MD  magnesium  oxide (MAG-OX) 400 MG tablet Take by mouth. 09/25/16  Yes [provider]  Omega-3 Fatty Acids (FISH OIL) 1000 MG CAPS Take by mouth. 05/15/22  Yes [provider]  Multiple Vitamin (MULTI-VITAMIN) tablet Take 1 tablet by mouth daily.    [provider]  testosterone cypionate (DEPOTESTOSTERONE CYPIONATE) 200 MG/ML injection Inject 200 mg into the muscle once a week.    [provider]    Family History History reviewed. No pertinent family history.  Social History Social History   Tobacco Use   Smoking status: Never    Passive exposure: Never   Smokeless tobacco: Never  Vaping Use   Vaping Use: Never used  Substance Use Topics   Alcohol use: Yes    Alcohol/week: 3.0 standard drinks of alcohol    Types: 3 Standard drinks or equivalent per week   Drug use: Not Currently     Allergies   Ciprofloxacin, Famciclovir, Omeprazole, Codeine, and Tetracycline   Review of Systems Review of Systems See HPI  Physical Exam Triage Vital Signs ED Triage Vitals  Enc Vitals Group     BP 01/11/23 1336 123/83     Pulse Rate 01/11/23 1336 86     Resp 01/11/23 1336 14     Temp 01/11/23 1336 98.6 F (37 C)     Temp Source 01/11/23 1336 Oral  SpO2 01/11/23 1336 97 %     Weight 01/11/23 1328 178 lb (80.7 kg)     Height 01/11/23 1328 6' (1.829 m)     Head Circumference --      Peak Flow --      Pain Score 01/11/23 1327 8     Pain Loc --      Pain Edu? --      Excl. in [place]? --    No data found.  Updated Vital Signs BP 123/83 (BP Location: Right Arm)   Pulse 86   Temp 98.6 F (37 C) (Oral)   Resp 14   Ht 6' (1.829 m)   Wt 80.7 kg   SpO2 97%   BMI 24.14 kg/m       Physical Exam Constitutional:      General: He is not in acute distress.    Appearance: Normal appearance. He is well-developed.  HENT:     Head: Normocephalic and atraumatic.  Eyes:     Conjunctiva/sclera: Conjunctivae normal.     Pupils: Pupils are equal, round, and  reactive to light.  Cardiovascular:     Rate and Rhythm: Normal rate.  Pulmonary:     Effort: Pulmonary effort is normal. No respiratory distress.  Abdominal:     General: There is no distension.     Palpations: Abdomen is soft.  Musculoskeletal:        General: Normal range of motion.     Cervical back: Normal range of motion.  Skin:    General: Skin is warm and dry.     Findings: Erythema present.     Comments: There is a corn on the fifth toe of the left foot where it rubs against the fourth toe.  Surrounding this there is deep erythema and tenderness.  Neurological:     General: No focal deficit present.     Mental Status: He is alert.     Gait: Gait abnormal.      UC Treatments / Results  Labs (all labs ordered are listed, but only abnormal results are displayed) Labs Reviewed - No data to display  EKG   Radiology No results found.  Procedures Procedures (including critical care time)  Medications Ordered in UC Medications - No data to display  Initial Impression / Assessment and Plan / UC Course  I have reviewed the triage vital signs and the nursing notes.  Pertinent labs & imaging results that were available during my care of the patient were reviewed by me and considered in my medical decision making (see chart for details).     Patient has a podiatrist.  He is told to see the podiatrist if he fails to improve.  I do not think he fractured his toe, and even if he did it would not change management to get an x-ray. Final Clinical Impressions(s) / UC Diagnoses   Final diagnoses:  Infection of toe     Discharge Instructions      Take cephalexin (Keflex) 3 times a day Warm soaks may help Take your Celebrex or usual anti-inflammatory daily Take hydrocodone if needed for severe pain.  Do not drive on the hydrocodone See your foot doctor if this problem persists   ED Prescriptions     Medication Sig Dispense Auth. Provider   cephALEXin (KEFLEX)  500 MG capsule Take 1 capsule (500 mg total) by mouth 3 (three) times daily. 21 capsule Raylene Everts, MD   HYDROcodone-acetaminophen [facility]) 7.5-325 MG tablet Take 1  tablet by mouth every 6 (six) hours as needed for moderate pain. 10 tablet Raylene Everts, MD      I have reviewed the PDMP during this encounter.   Raylene Everts, MD 01/11/23 [phone]

## 2023-01-11 NOTE — ED Triage Notes (Signed)
Left small toe pain x 3 weeks  Hit the left small toe on a door jamb  Pain  since then  Redness to left toe w/ calloused area  Previous surgery on left  foot x 2  OTC meds - celebrex for back pain  Pain increased 3 days ago- sharp pain

## 2023-01-12 ENCOUNTER — Telehealth: Payer: Self-pay | Admitting: Emergency Medicine

## 2023-01-12 NOTE — Telephone Encounter (Signed)
[place].  Advised if doing well to disregard the call.  Any questions or concerns feel free to give the office a call back.  Otherwise follow up as needed.
